# Patient Record
Sex: Female | Born: 1950 | Race: White | Hispanic: No | Marital: Married | Smoking: Former smoker
Health system: Southern US, Community
[De-identification: ages and names within clinical notes are randomized; demographics above are authoritative.]

## PROBLEM LIST (undated history)

## (undated) DIAGNOSIS — M1990 Unspecified osteoarthritis, unspecified site: Secondary | ICD-10-CM

## (undated) DIAGNOSIS — F419 Anxiety disorder, unspecified: Secondary | ICD-10-CM

## (undated) DIAGNOSIS — F32A Depression, unspecified: Secondary | ICD-10-CM

## (undated) DIAGNOSIS — K219 Gastro-esophageal reflux disease without esophagitis: Secondary | ICD-10-CM

## (undated) DIAGNOSIS — R51 Headache: Secondary | ICD-10-CM

## (undated) DIAGNOSIS — I10 Essential (primary) hypertension: Secondary | ICD-10-CM

## (undated) DIAGNOSIS — F329 Major depressive disorder, single episode, unspecified: Secondary | ICD-10-CM

## (undated) HISTORY — PX: ABDOMINAL HYSTERECTOMY: SHX81

## (undated) HISTORY — PX: COLONOSCOPY: SHX174

---

## 2001-10-04 ENCOUNTER — Encounter: Admission: RE | Admit: 2001-10-04 | Discharge: 2001-10-04 | Payer: Self-pay | Admitting: Unknown Physician Specialty

## 2001-10-04 ENCOUNTER — Encounter: Payer: Self-pay | Admitting: Unknown Physician Specialty

## 2005-01-09 ENCOUNTER — Ambulatory Visit (HOSPITAL_COMMUNITY): Admission: RE | Admit: 2005-01-09 | Discharge: 2005-01-09 | Payer: Self-pay | Admitting: Internal Medicine

## 2005-03-18 ENCOUNTER — Ambulatory Visit (HOSPITAL_COMMUNITY): Admission: RE | Admit: 2005-03-18 | Discharge: 2005-03-18 | Payer: Self-pay | Admitting: Family Medicine

## 2005-04-13 ENCOUNTER — Ambulatory Visit: Payer: Self-pay | Admitting: Internal Medicine

## 2005-06-01 ENCOUNTER — Ambulatory Visit: Payer: Self-pay | Admitting: Internal Medicine

## 2005-06-01 ENCOUNTER — Ambulatory Visit (HOSPITAL_COMMUNITY): Admission: RE | Admit: 2005-06-01 | Discharge: 2005-06-01 | Payer: Self-pay | Admitting: Internal Medicine

## 2005-06-12 ENCOUNTER — Ambulatory Visit (HOSPITAL_COMMUNITY): Admission: RE | Admit: 2005-06-12 | Discharge: 2005-06-12 | Payer: Self-pay | Admitting: Internal Medicine

## 2006-08-09 IMAGING — CR DG THORACIC SPINE 2V
3 series · 3 of 3 positions shown · non-contrast
Comparison: none

CLINICAL DATA: Upper back pain and swelling since October 2004 when she hurt back lifting weights.  
 THORACIC SPINE ? [facility] AND LATERAL VIEWS ? 01/09/05: 
 There is a mild upper thoracic scoliosis centered at T3-4 with convexity to the left.  There is no fracture or disk space narrowing or bone destruction.  No paraspinal mass.

[view not recorded (1 of 3)]
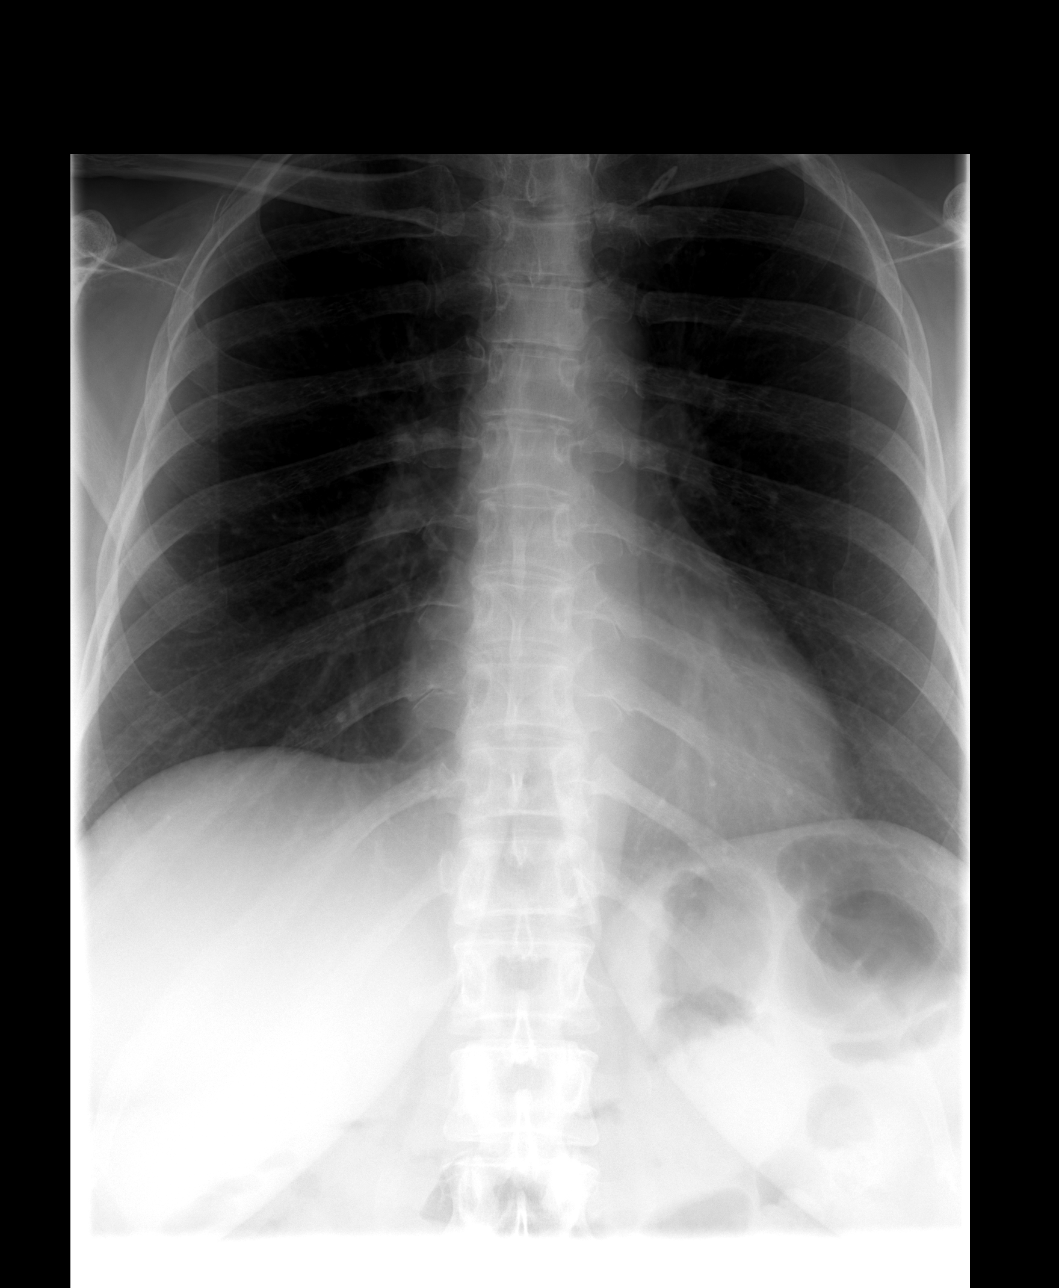

[view not recorded (2 of 3)]
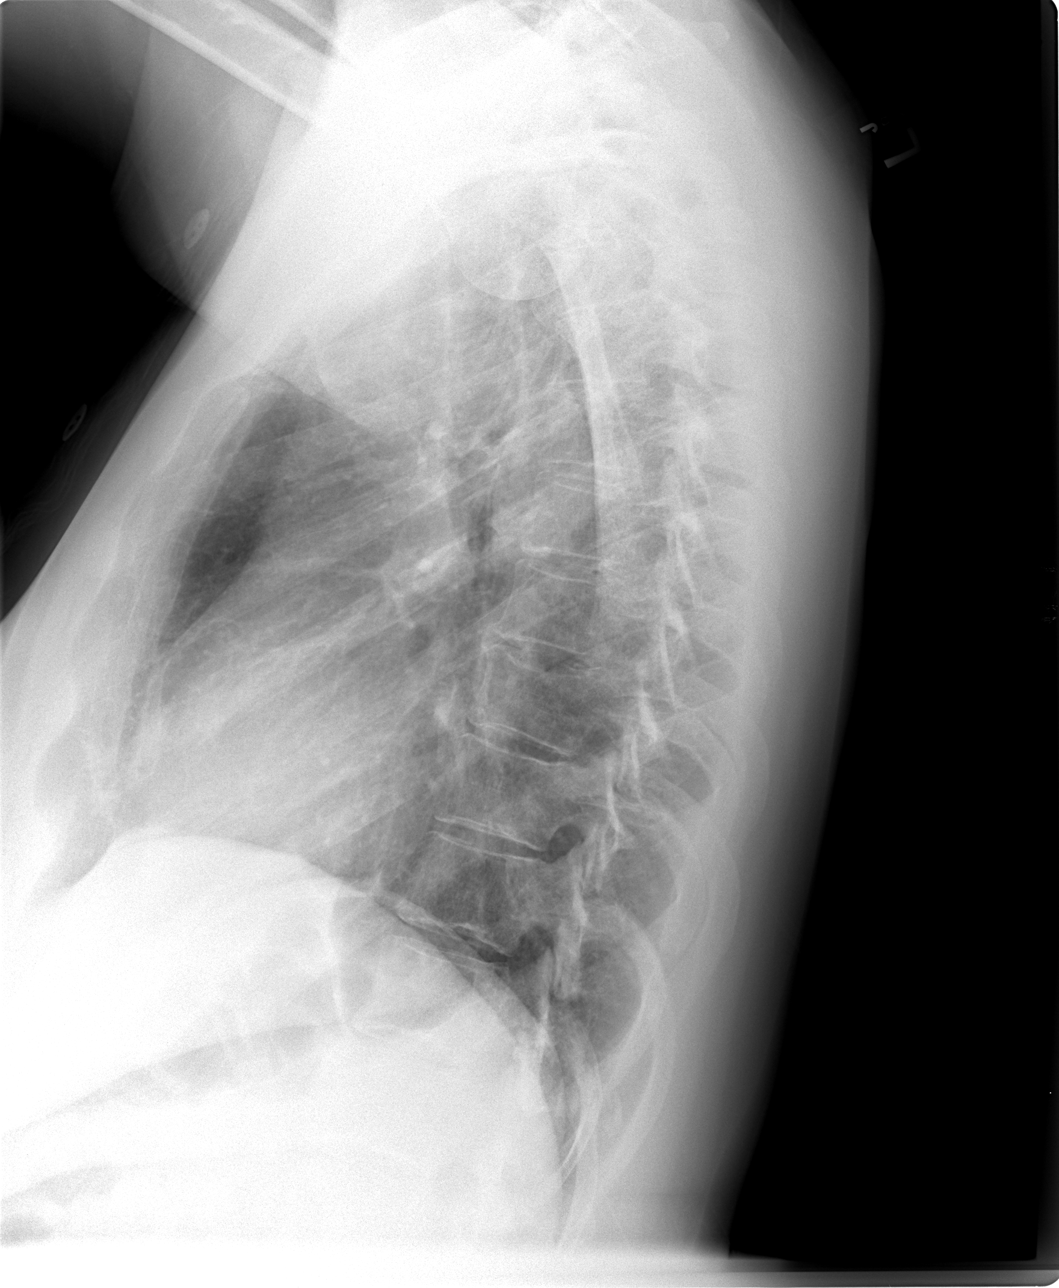

[view not recorded (3 of 3)]
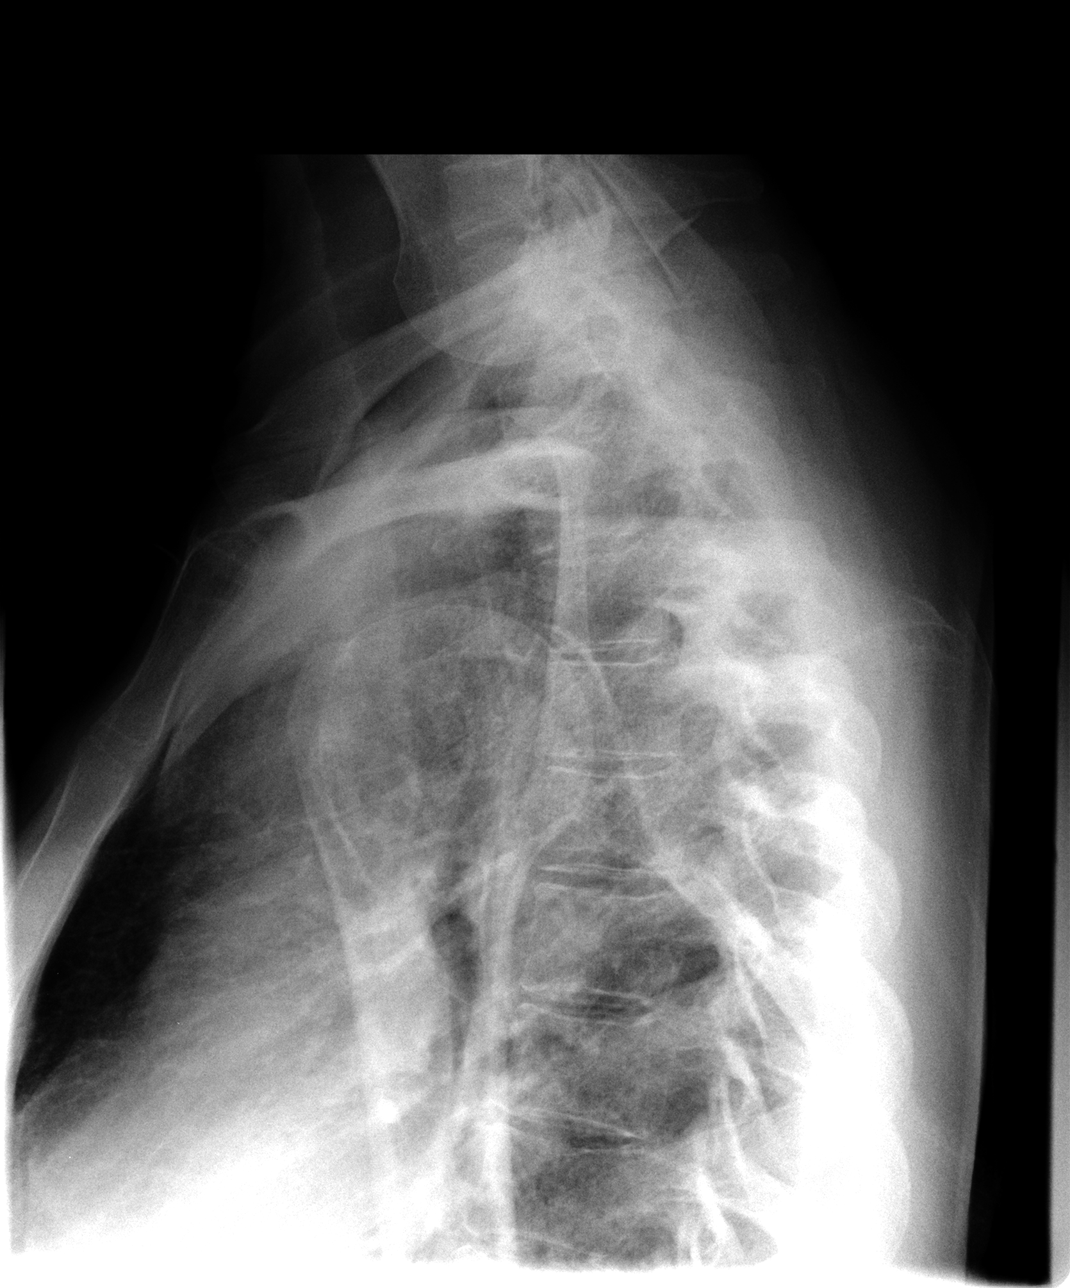

[3 of 3 positions shown; findings below may reference images not displayed]

IMPRESSION: No acute abnormality.  Mild upper thoracic scoliosis.
 CERVICAL SPINE ? 5 VIEWS ? 01/09/05:
 There is some mild degenerative disk disease at C5-6 and C6-7 and slight spurring primarily to the left of midline.  However, there is no significant foraminal stenosis.  There is no significant facet joint arthritis.  Again noted is the mild upper thoracic scoliosis.
IMPRESSION: Mild degenerative disk disease at C5-6 and C6-7.

## 2013-05-03 ENCOUNTER — Encounter (INDEPENDENT_AMBULATORY_CARE_PROVIDER_SITE_OTHER): Payer: Self-pay | Admitting: *Deleted

## 2013-05-10 ENCOUNTER — Telehealth (INDEPENDENT_AMBULATORY_CARE_PROVIDER_SITE_OTHER): Payer: Self-pay | Admitting: *Deleted

## 2013-05-10 ENCOUNTER — Other Ambulatory Visit (INDEPENDENT_AMBULATORY_CARE_PROVIDER_SITE_OTHER): Payer: Self-pay | Admitting: *Deleted

## 2013-05-10 ENCOUNTER — Encounter (INDEPENDENT_AMBULATORY_CARE_PROVIDER_SITE_OTHER): Payer: Self-pay | Admitting: *Deleted

## 2013-05-10 DIAGNOSIS — Z1211 Encounter for screening for malignant neoplasm of colon: Secondary | ICD-10-CM

## 2013-05-10 NOTE — Telephone Encounter (Signed)
Patient needs movi prep 

## 2013-05-11 MED ORDER — PEG-KCL-NACL-NASULF-NA ASC-C 100 G PO SOLR: 1.0000 | Freq: Once | ORAL | Status: DC

## 2013-05-18 ENCOUNTER — Telehealth (INDEPENDENT_AMBULATORY_CARE_PROVIDER_SITE_OTHER): Payer: Self-pay | Admitting: *Deleted

## 2013-05-18 NOTE — Telephone Encounter (Signed)
  Procedure: tcs  Reason/Indication:  screening  Has patient had this procedure before?  Yes, 2006  If so, when, by whom and where?    Is there a family history of colon cancer?  no  Who?  What age when diagnosed?    Is patient diabetic?   no      Does patient have prosthetic heart valve?  no  Do you have a pacemaker?  no  Has patient ever had endocarditis? no  Has patient had joint replacement within last 12 months?  no  Is patient on Coumadin, Plavix and/or Aspirin? yes  Medications: asa 81 mg daily, effexor 150 mg daily, metoprolol 50 mg bid, probiotic otc daily, xanax 1 mg 1/2 tab prn, miralax prn  Allergies: nkda  Medication Adjustment: asa 2 days  Procedure date & time: 06/15/13 at 1030

## 2013-05-19 NOTE — Telephone Encounter (Signed)
Her PCP, Dr Sherril Croon is requesting it

## 2013-05-19 NOTE — Telephone Encounter (Signed)
agree

## 2013-05-19 NOTE — Telephone Encounter (Signed)
It hasn't been 10 yrs? Does she need this colonnoscopy?

## 2013-05-25 ENCOUNTER — Encounter (HOSPITAL_COMMUNITY): Payer: Self-pay | Admitting: Pharmacy Technician

## 2013-06-15 ENCOUNTER — Encounter (HOSPITAL_COMMUNITY): Payer: Self-pay | Admitting: *Deleted

## 2013-06-15 ENCOUNTER — Encounter (HOSPITAL_COMMUNITY)
Admission: RE | Disposition: A | Discharge status: Home / Self Care | Payer: Self-pay | Source: Ambulatory Visit | Attending: Internal Medicine

## 2013-06-15 ENCOUNTER — Ambulatory Visit (HOSPITAL_COMMUNITY)
Admission: RE | Admit: 2013-06-15 | Discharge: 2013-06-15 | Disposition: A | Discharge status: Home / Self Care | Payer: BC Managed Care – PPO | Source: Ambulatory Visit | Attending: Internal Medicine | Admitting: Internal Medicine

## 2013-06-15 DIAGNOSIS — K921 Melena: Secondary | ICD-10-CM

## 2013-06-15 DIAGNOSIS — K644 Residual hemorrhoidal skin tags: Secondary | ICD-10-CM

## 2013-06-15 DIAGNOSIS — K5730 Diverticulosis of large intestine without perforation or abscess without bleeding: Secondary | ICD-10-CM

## 2013-06-15 DIAGNOSIS — R198 Other specified symptoms and signs involving the digestive system and abdomen: Secondary | ICD-10-CM

## 2013-06-15 DIAGNOSIS — Z1211 Encounter for screening for malignant neoplasm of colon: Secondary | ICD-10-CM

## 2013-06-15 DIAGNOSIS — I10 Essential (primary) hypertension: Secondary | ICD-10-CM | POA: Insufficient documentation

## 2013-06-15 HISTORY — PX: COLONOSCOPY: SHX5424

## 2013-06-15 HISTORY — DX: Headache: R51

## 2013-06-15 HISTORY — DX: Essential (primary) hypertension: I10

## 2013-06-15 HISTORY — DX: Major depressive disorder, single episode, unspecified: F32.9

## 2013-06-15 HISTORY — DX: Unspecified osteoarthritis, unspecified site: M19.90

## 2013-06-15 HISTORY — DX: Gastro-esophageal reflux disease without esophagitis: K21.9

## 2013-06-15 HISTORY — DX: Depression, unspecified: F32.A

## 2013-06-15 HISTORY — DX: Anxiety disorder, unspecified: F41.9

## 2013-06-15 SURGERY — COLONOSCOPY
Anesthesia: Moderate Sedation

## 2013-06-15 MED ORDER — STERILE WATER FOR IRRIGATION IR SOLN
Status: DC | PRN
  Administered 2013-06-15: 10:00:00

## 2013-06-15 MED ORDER — LUBIPROSTONE 8 MCG PO CAPS: 16.0000 ug | ORAL_CAPSULE | Freq: Every day | ORAL | Status: DC

## 2013-06-15 MED ORDER — MEPERIDINE HCL 50 MG/ML IJ SOLN
INTRAMUSCULAR | Status: AC
  Filled 2013-06-15: qty 1

## 2013-06-15 MED ORDER — MEPERIDINE HCL 50 MG/ML IJ SOLN
INTRAMUSCULAR | Status: DC | PRN
  Administered 2013-06-15 (×2): 25 mg via INTRAVENOUS

## 2013-06-15 MED ORDER — MIDAZOLAM HCL 5 MG/5ML IJ SOLN
INTRAMUSCULAR | Status: AC
  Filled 2013-06-15: qty 10

## 2013-06-15 MED ORDER — SODIUM CHLORIDE 0.9 % IV SOLN
INTRAVENOUS | Status: DC
  Administered 2013-06-15: 10:00:00 via INTRAVENOUS

## 2013-06-15 MED ORDER — MIDAZOLAM HCL 5 MG/5ML IJ SOLN
INTRAMUSCULAR | Status: DC | PRN
  Administered 2013-06-15: 1 mg via INTRAVENOUS
  Administered 2013-06-15: 2 mg via INTRAVENOUS
  Administered 2013-06-15: 3 mg via INTRAVENOUS
  Administered 2013-06-15 (×2): 2 mg via INTRAVENOUS

## 2013-06-15 NOTE — H&P (Signed)
Suzanne Reese is an 62 y.o. female.   Chief Complaint: Patient is here for colonoscopy. HPI: Patient is 62 year old Caucasian female who presents with change in bowel habits and intermittent hematochezia. She has tried MiraLax and Citrucel  without any benefit. She has good appetite. She denies abdominal pain. Patient's last colonoscopy was in 2006. Family history is negative for colorectal carcinoma.  Past Medical History  Diagnosis Date  . Hypertension   . Anxiety   . Depression   . Headache(784.0)     History of migraines  . Arthritis   . GERD (gastroesophageal reflux disease)     occasionally    Past Surgical History  Procedure Laterality Date  . Abdominal hysterectomy    . Colonoscopy      Family History  Problem Relation Age of Onset  . Colon cancer Neg Hx    Social History:  reports that she has quit smoking. Her smoking use included Cigarettes. She has a 10 pack-year smoking history. She does not have any smokeless tobacco history on file. She reports that she drinks about 1.5 ounces of alcohol per week. She reports that she does not use illicit drugs.  Allergies: No Known Allergies  Medications Prior to Admission  Medication Sig Dispense Refill  . aspirin EC 81 MG tablet Take 81 mg by mouth daily.      Marland Kitchen LORazepam (ATIVAN) 0.5 MG tablet Take 0.5 mg by mouth every 8 (eight) hours as needed for anxiety.      . metoprolol (LOPRESSOR) 50 MG tablet Take 50 mg by mouth 2 (two) times daily.      Marland Kitchen OVER THE COUNTER MEDICATION Take 1 capsule by mouth daily. Phillips probiotic      . venlafaxine XR (EFFEXOR-XR) 150 MG 24 hr capsule Take 150 mg by mouth daily.      Marland Kitchen ALPRAZolam (XANAX) 1 MG tablet Take 0.5 mg by mouth at bedtime as needed for sleep.        No results found for this or any previous visit (from the past 48 hour(s)). No results found.  ROS  Blood pressure 144/75, pulse 62, temperature 97.9 F (36.6 C), temperature source Oral, resp. rate 20, height 5\' 9"   (1.753 m), weight 184 lb (83.462 kg), SpO2 99.00%. Physical Exam  Constitutional: She appears well-developed and well-nourished.  HENT:  Mouth/Throat: Oropharynx is clear and moist.  Eyes: Conjunctivae are normal. No scleral icterus.  Neck: No thyromegaly present.  Cardiovascular: Normal rate, regular rhythm and normal heart sounds.   No murmur heard. Respiratory: Effort normal and breath sounds normal.  GI: Soft. She exhibits no distension and no mass. There is no tenderness.  Musculoskeletal: She exhibits no edema.  Lymphadenopathy:    She has no cervical adenopathy.  Neurological: She is alert.  Skin: Skin is warm and dry.     Assessment/Plan Change in bowel habits. Hematochezia. Diagnostic colonoscopy.  REHMAN,NAJEEB U 06/15/2013, 10:26 AM

## 2013-06-15 NOTE — Op Note (Signed)
COLONOSCOPY PROCEDURE REPORT  PATIENT:  Suzanne Reese  MR#:  [id number] Birthdate:  1951-05-06, 62 y.o., female Endoscopist:  Dr. Malissa Hippo, MD Referred By:  Dr. Ignatius Specking, MD Procedure Date: 06/15/2013  Procedure:   Colonoscopy  Indications:  Patient is 62 year old Caucasian female who presents with change in her bowel habits. She has noted her stools to be hard and also of different shapes and sizes. She also has intermittent hematochezia. Patient's last colonoscopy was over 8 years ago.  Informed Consent:  The procedure and risks were reviewed with the patient and informed consent was obtained.  Medications:  Demerol 50 mg IV Versed 10 mg IV  Description of procedure:  After a digital rectal exam was performed, that colonoscope was advanced from the anus through the rectum and colon to the area of the cecum, ileocecal valve and appendiceal orifice. The cecum was deeply intubated. These structures were well-seen and photographed for the record. From the level of the cecum and ileocecal valve, the scope was slowly and cautiously withdrawn. The mucosal surfaces were carefully surveyed utilizing scope tip to flexion to facilitate fold flattening as needed. The scope was pulled down into the rectum where a thorough exam including retroflexion was performed. Eminent ileum was also examined.  Findings:   Prep excellent. Normal mucosa of terminal ileum. Few small diverticula noted at sigmoid colon. Normal rectal mucosa. Small hemorrhoids below the dentate line.   Therapeutic/Diagnostic Maneuvers Performed:  None  Complications:  None  Cecal Withdrawal Time:  10 minutes  Impression:  Normal mucosa of terminal ileum. Few small diverticula at sigmoid colon. Small external hemorrhoids.  Recommendations:  Standard instructions given. Continue high fiber diet. Fiber supplement 3-4 g by mouth daily. Amitiza age to 16 mcg by mouth every morning. Progress report in two  months.  REHMAN,NAJEEB U  06/15/2013 11:02 AM  CC: Dr. Ignatius Specking., MD & Dr. Bonnetta Barry ref. provider found

## 2013-06-20 ENCOUNTER — Encounter (HOSPITAL_COMMUNITY): Payer: Self-pay | Admitting: Internal Medicine

## 2013-07-13 ENCOUNTER — Telehealth (INDEPENDENT_AMBULATORY_CARE_PROVIDER_SITE_OTHER): Payer: Self-pay | Admitting: *Deleted

## 2013-07-13 DIAGNOSIS — K649 Unspecified hemorrhoids: Secondary | ICD-10-CM

## 2013-07-13 MED ORDER — LIDOCAINE-HYDROCORTISONE ACE 3-0.5 % RE CREA: 1.0000 | TOPICAL_CREAM | Freq: Two times a day (BID) | RECTAL | Status: DC

## 2013-07-13 NOTE — Telephone Encounter (Signed)
Still having a lot of discomfort with her hemorrhoids (pain and raw feeling). Would like to see if Dr. Karilyn Cota would please call in a Rx for Lidazone HC Cream. Her husband has some and it seems to help. She is out. The return phone number is [phone].

## 2013-07-13 NOTE — Telephone Encounter (Signed)
Rx for Lidazone HC eprescribed to [facility] in [place].

## 2013-07-13 NOTE — Telephone Encounter (Signed)
Camelia Eng - would you please address this, please?

## 2015-10-29 DIAGNOSIS — R69 Illness, unspecified: Secondary | ICD-10-CM | POA: Diagnosis not present

## 2015-10-29 DIAGNOSIS — G4700 Insomnia, unspecified: Secondary | ICD-10-CM | POA: Diagnosis not present

## 2015-10-29 DIAGNOSIS — E782 Mixed hyperlipidemia: Secondary | ICD-10-CM | POA: Diagnosis not present

## 2015-10-29 DIAGNOSIS — E538 Deficiency of other specified B group vitamins: Secondary | ICD-10-CM | POA: Diagnosis not present

## 2015-10-29 DIAGNOSIS — I10 Essential (primary) hypertension: Secondary | ICD-10-CM | POA: Diagnosis not present

## 2015-12-02 DIAGNOSIS — Z1231 Encounter for screening mammogram for malignant neoplasm of breast: Secondary | ICD-10-CM | POA: Diagnosis not present

## 2015-12-11 DIAGNOSIS — N952 Postmenopausal atrophic vaginitis: Secondary | ICD-10-CM | POA: Diagnosis not present

## 2015-12-11 DIAGNOSIS — Z1272 Encounter for screening for malignant neoplasm of vagina: Secondary | ICD-10-CM | POA: Diagnosis not present

## 2015-12-11 DIAGNOSIS — Z6824 Body mass index (BMI) 24.0-24.9, adult: Secondary | ICD-10-CM | POA: Diagnosis not present

## 2015-12-11 DIAGNOSIS — Z01419 Encounter for gynecological examination (general) (routine) without abnormal findings: Secondary | ICD-10-CM | POA: Diagnosis not present

## 2016-01-13 DIAGNOSIS — L299 Pruritus, unspecified: Secondary | ICD-10-CM | POA: Diagnosis not present

## 2016-02-27 DIAGNOSIS — L821 Other seborrheic keratosis: Secondary | ICD-10-CM | POA: Diagnosis not present

## 2016-02-27 DIAGNOSIS — L309 Dermatitis, unspecified: Secondary | ICD-10-CM | POA: Diagnosis not present

## 2016-04-27 DIAGNOSIS — I10 Essential (primary) hypertension: Secondary | ICD-10-CM | POA: Diagnosis not present

## 2016-04-27 DIAGNOSIS — E782 Mixed hyperlipidemia: Secondary | ICD-10-CM | POA: Diagnosis not present

## 2016-06-16 DIAGNOSIS — H5210 Myopia, unspecified eye: Secondary | ICD-10-CM | POA: Diagnosis not present

## 2016-06-16 DIAGNOSIS — I10 Essential (primary) hypertension: Secondary | ICD-10-CM | POA: Diagnosis not present

## 2016-06-16 DIAGNOSIS — H25813 Combined forms of age-related cataract, bilateral: Secondary | ICD-10-CM | POA: Diagnosis not present

## 2016-08-24 DIAGNOSIS — H00022 Hordeolum internum right lower eyelid: Secondary | ICD-10-CM | POA: Diagnosis not present

## 2016-09-29 DIAGNOSIS — M546 Pain in thoracic spine: Secondary | ICD-10-CM | POA: Diagnosis not present

## 2016-09-29 DIAGNOSIS — M47812 Spondylosis without myelopathy or radiculopathy, cervical region: Secondary | ICD-10-CM | POA: Diagnosis not present

## 2016-09-29 DIAGNOSIS — M9901 Segmental and somatic dysfunction of cervical region: Secondary | ICD-10-CM | POA: Diagnosis not present

## 2016-09-30 DIAGNOSIS — M546 Pain in thoracic spine: Secondary | ICD-10-CM | POA: Diagnosis not present

## 2016-09-30 DIAGNOSIS — S134XXA Sprain of ligaments of cervical spine, initial encounter: Secondary | ICD-10-CM | POA: Diagnosis not present

## 2016-09-30 DIAGNOSIS — M9902 Segmental and somatic dysfunction of thoracic region: Secondary | ICD-10-CM | POA: Diagnosis not present

## 2016-09-30 DIAGNOSIS — M9901 Segmental and somatic dysfunction of cervical region: Secondary | ICD-10-CM | POA: Diagnosis not present

## 2016-09-30 DIAGNOSIS — M47812 Spondylosis without myelopathy or radiculopathy, cervical region: Secondary | ICD-10-CM | POA: Diagnosis not present

## 2016-10-02 DIAGNOSIS — M47812 Spondylosis without myelopathy or radiculopathy, cervical region: Secondary | ICD-10-CM | POA: Diagnosis not present

## 2016-10-02 DIAGNOSIS — M9902 Segmental and somatic dysfunction of thoracic region: Secondary | ICD-10-CM | POA: Diagnosis not present

## 2016-10-02 DIAGNOSIS — S134XXA Sprain of ligaments of cervical spine, initial encounter: Secondary | ICD-10-CM | POA: Diagnosis not present

## 2016-10-02 DIAGNOSIS — M546 Pain in thoracic spine: Secondary | ICD-10-CM | POA: Diagnosis not present

## 2016-10-02 DIAGNOSIS — M9901 Segmental and somatic dysfunction of cervical region: Secondary | ICD-10-CM | POA: Diagnosis not present

## 2016-10-05 DIAGNOSIS — S134XXA Sprain of ligaments of cervical spine, initial encounter: Secondary | ICD-10-CM | POA: Diagnosis not present

## 2016-10-05 DIAGNOSIS — M9901 Segmental and somatic dysfunction of cervical region: Secondary | ICD-10-CM | POA: Diagnosis not present

## 2016-10-05 DIAGNOSIS — M9902 Segmental and somatic dysfunction of thoracic region: Secondary | ICD-10-CM | POA: Diagnosis not present

## 2016-10-05 DIAGNOSIS — M47812 Spondylosis without myelopathy or radiculopathy, cervical region: Secondary | ICD-10-CM | POA: Diagnosis not present

## 2016-10-05 DIAGNOSIS — M546 Pain in thoracic spine: Secondary | ICD-10-CM | POA: Diagnosis not present

## 2016-11-02 DIAGNOSIS — R69 Illness, unspecified: Secondary | ICD-10-CM | POA: Diagnosis not present

## 2016-11-02 DIAGNOSIS — E782 Mixed hyperlipidemia: Secondary | ICD-10-CM | POA: Diagnosis not present

## 2016-11-02 DIAGNOSIS — I10 Essential (primary) hypertension: Secondary | ICD-10-CM | POA: Diagnosis not present

## 2016-11-02 DIAGNOSIS — G4700 Insomnia, unspecified: Secondary | ICD-10-CM | POA: Diagnosis not present

## 2016-11-02 DIAGNOSIS — E559 Vitamin D deficiency, unspecified: Secondary | ICD-10-CM | POA: Diagnosis not present

## 2016-11-12 DIAGNOSIS — M419 Scoliosis, unspecified: Secondary | ICD-10-CM | POA: Diagnosis not present

## 2016-11-12 DIAGNOSIS — E782 Mixed hyperlipidemia: Secondary | ICD-10-CM | POA: Diagnosis not present

## 2016-11-12 DIAGNOSIS — Z0000 Encounter for general adult medical examination without abnormal findings: Secondary | ICD-10-CM | POA: Diagnosis not present

## 2016-11-12 DIAGNOSIS — R251 Tremor, unspecified: Secondary | ICD-10-CM | POA: Diagnosis not present

## 2016-11-12 DIAGNOSIS — I10 Essential (primary) hypertension: Secondary | ICD-10-CM | POA: Diagnosis not present

## 2016-11-12 DIAGNOSIS — Z6823 Body mass index (BMI) 23.0-23.9, adult: Secondary | ICD-10-CM | POA: Diagnosis not present

## 2016-11-13 DIAGNOSIS — M47814 Spondylosis without myelopathy or radiculopathy, thoracic region: Secondary | ICD-10-CM | POA: Diagnosis not present

## 2016-11-13 DIAGNOSIS — M50322 Other cervical disc degeneration at C5-C6 level: Secondary | ICD-10-CM | POA: Diagnosis not present

## 2016-11-13 DIAGNOSIS — M50321 Other cervical disc degeneration at C4-C5 level: Secondary | ICD-10-CM | POA: Diagnosis not present

## 2016-11-13 DIAGNOSIS — M419 Scoliosis, unspecified: Secondary | ICD-10-CM | POA: Diagnosis not present

## 2016-11-13 DIAGNOSIS — M542 Cervicalgia: Secondary | ICD-10-CM | POA: Diagnosis not present

## 2016-11-13 DIAGNOSIS — M4316 Spondylolisthesis, lumbar region: Secondary | ICD-10-CM | POA: Diagnosis not present

## 2016-11-13 DIAGNOSIS — M47816 Spondylosis without myelopathy or radiculopathy, lumbar region: Secondary | ICD-10-CM | POA: Diagnosis not present

## 2016-12-03 DIAGNOSIS — Z1231 Encounter for screening mammogram for malignant neoplasm of breast: Secondary | ICD-10-CM | POA: Diagnosis not present

## 2017-01-25 ENCOUNTER — Other Ambulatory Visit (HOSPITAL_COMMUNITY): Payer: Self-pay | Admitting: Family Medicine

## 2017-01-25 DIAGNOSIS — M810 Age-related osteoporosis without current pathological fracture: Secondary | ICD-10-CM

## 2017-02-04 ENCOUNTER — Ambulatory Visit (HOSPITAL_COMMUNITY)
Admission: RE | Admit: 2017-02-04 | Discharge: 2017-02-04 | Disposition: A | Discharge status: Home / Self Care | Payer: Medicare HMO | Source: Ambulatory Visit | Attending: Family Medicine | Admitting: Family Medicine

## 2017-02-04 DIAGNOSIS — M810 Age-related osteoporosis without current pathological fracture: Secondary | ICD-10-CM | POA: Diagnosis not present

## 2017-02-04 DIAGNOSIS — E2839 Other primary ovarian failure: Secondary | ICD-10-CM | POA: Diagnosis not present

## 2017-02-10 DIAGNOSIS — M545 Low back pain: Secondary | ICD-10-CM | POA: Diagnosis not present

## 2017-02-10 DIAGNOSIS — M542 Cervicalgia: Secondary | ICD-10-CM | POA: Diagnosis not present

## 2017-02-10 DIAGNOSIS — M47812 Spondylosis without myelopathy or radiculopathy, cervical region: Secondary | ICD-10-CM | POA: Diagnosis not present

## 2017-02-12 DIAGNOSIS — M546 Pain in thoracic spine: Secondary | ICD-10-CM | POA: Diagnosis not present

## 2017-02-12 DIAGNOSIS — M542 Cervicalgia: Secondary | ICD-10-CM | POA: Diagnosis not present

## 2017-02-12 DIAGNOSIS — M6281 Muscle weakness (generalized): Secondary | ICD-10-CM | POA: Diagnosis not present

## 2017-02-12 DIAGNOSIS — M549 Dorsalgia, unspecified: Secondary | ICD-10-CM | POA: Diagnosis not present

## 2017-02-22 DIAGNOSIS — M6281 Muscle weakness (generalized): Secondary | ICD-10-CM | POA: Diagnosis not present

## 2017-02-22 DIAGNOSIS — M542 Cervicalgia: Secondary | ICD-10-CM | POA: Diagnosis not present

## 2017-02-22 DIAGNOSIS — M546 Pain in thoracic spine: Secondary | ICD-10-CM | POA: Diagnosis not present

## 2017-02-22 DIAGNOSIS — M549 Dorsalgia, unspecified: Secondary | ICD-10-CM | POA: Diagnosis not present

## 2017-05-31 DIAGNOSIS — I10 Essential (primary) hypertension: Secondary | ICD-10-CM | POA: Diagnosis not present

## 2017-05-31 DIAGNOSIS — E782 Mixed hyperlipidemia: Secondary | ICD-10-CM | POA: Diagnosis not present

## 2017-05-31 DIAGNOSIS — R251 Tremor, unspecified: Secondary | ICD-10-CM | POA: Diagnosis not present

## 2017-05-31 DIAGNOSIS — Z6823 Body mass index (BMI) 23.0-23.9, adult: Secondary | ICD-10-CM | POA: Diagnosis not present

## 2017-09-16 DIAGNOSIS — Z01419 Encounter for gynecological examination (general) (routine) without abnormal findings: Secondary | ICD-10-CM | POA: Diagnosis not present

## 2017-09-16 DIAGNOSIS — N952 Postmenopausal atrophic vaginitis: Secondary | ICD-10-CM | POA: Diagnosis not present

## 2017-09-16 DIAGNOSIS — N904 Leukoplakia of vulva: Secondary | ICD-10-CM | POA: Diagnosis not present

## 2017-11-17 DIAGNOSIS — R739 Hyperglycemia, unspecified: Secondary | ICD-10-CM | POA: Diagnosis not present

## 2017-11-17 DIAGNOSIS — G4700 Insomnia, unspecified: Secondary | ICD-10-CM | POA: Diagnosis not present

## 2017-11-17 DIAGNOSIS — R69 Illness, unspecified: Secondary | ICD-10-CM | POA: Diagnosis not present

## 2017-11-17 DIAGNOSIS — E559 Vitamin D deficiency, unspecified: Secondary | ICD-10-CM | POA: Diagnosis not present

## 2017-11-17 DIAGNOSIS — E7800 Pure hypercholesterolemia, unspecified: Secondary | ICD-10-CM | POA: Diagnosis not present

## 2017-11-17 DIAGNOSIS — R1011 Right upper quadrant pain: Secondary | ICD-10-CM | POA: Diagnosis not present

## 2017-11-17 DIAGNOSIS — R7303 Prediabetes: Secondary | ICD-10-CM | POA: Diagnosis not present

## 2017-11-17 DIAGNOSIS — I10 Essential (primary) hypertension: Secondary | ICD-10-CM | POA: Diagnosis not present

## 2017-11-22 DIAGNOSIS — R1011 Right upper quadrant pain: Secondary | ICD-10-CM | POA: Diagnosis not present

## 2017-12-16 DIAGNOSIS — E559 Vitamin D deficiency, unspecified: Secondary | ICD-10-CM | POA: Diagnosis not present

## 2017-12-16 DIAGNOSIS — E7800 Pure hypercholesterolemia, unspecified: Secondary | ICD-10-CM | POA: Diagnosis not present

## 2017-12-16 DIAGNOSIS — I10 Essential (primary) hypertension: Secondary | ICD-10-CM | POA: Diagnosis not present

## 2017-12-16 DIAGNOSIS — R93429 Abnormal radiologic findings on diagnostic imaging of unspecified kidney: Secondary | ICD-10-CM | POA: Diagnosis not present

## 2017-12-16 DIAGNOSIS — R69 Illness, unspecified: Secondary | ICD-10-CM | POA: Diagnosis not present

## 2017-12-16 DIAGNOSIS — G4700 Insomnia, unspecified: Secondary | ICD-10-CM | POA: Diagnosis not present

## 2017-12-16 DIAGNOSIS — E875 Hyperkalemia: Secondary | ICD-10-CM | POA: Diagnosis not present

## 2017-12-16 DIAGNOSIS — R739 Hyperglycemia, unspecified: Secondary | ICD-10-CM | POA: Diagnosis not present

## 2017-12-16 DIAGNOSIS — Q649 Congenital malformation of urinary system, unspecified: Secondary | ICD-10-CM | POA: Diagnosis not present

## 2018-02-23 DIAGNOSIS — I10 Essential (primary) hypertension: Secondary | ICD-10-CM | POA: Diagnosis not present

## 2018-02-23 DIAGNOSIS — R55 Syncope and collapse: Secondary | ICD-10-CM | POA: Diagnosis not present

## 2018-03-02 DIAGNOSIS — I10 Essential (primary) hypertension: Secondary | ICD-10-CM | POA: Diagnosis not present

## 2018-03-02 DIAGNOSIS — R0602 Shortness of breath: Secondary | ICD-10-CM | POA: Diagnosis not present

## 2018-03-02 DIAGNOSIS — I080 Rheumatic disorders of both mitral and aortic valves: Secondary | ICD-10-CM | POA: Diagnosis not present

## 2018-03-02 DIAGNOSIS — R55 Syncope and collapse: Secondary | ICD-10-CM | POA: Diagnosis not present

## 2018-03-04 DIAGNOSIS — W5501XA Bitten by cat, initial encounter: Secondary | ICD-10-CM | POA: Diagnosis not present

## 2018-03-04 DIAGNOSIS — I10 Essential (primary) hypertension: Secondary | ICD-10-CM | POA: Diagnosis not present

## 2018-03-04 DIAGNOSIS — R55 Syncope and collapse: Secondary | ICD-10-CM | POA: Diagnosis not present

## 2018-03-04 DIAGNOSIS — R69 Illness, unspecified: Secondary | ICD-10-CM | POA: Diagnosis not present

## 2018-03-15 DIAGNOSIS — R55 Syncope and collapse: Secondary | ICD-10-CM | POA: Diagnosis not present

## 2018-03-29 DIAGNOSIS — R739 Hyperglycemia, unspecified: Secondary | ICD-10-CM | POA: Diagnosis not present

## 2018-03-29 DIAGNOSIS — I10 Essential (primary) hypertension: Secondary | ICD-10-CM | POA: Diagnosis not present

## 2018-03-29 DIAGNOSIS — R69 Illness, unspecified: Secondary | ICD-10-CM | POA: Diagnosis not present

## 2018-03-29 DIAGNOSIS — E7800 Pure hypercholesterolemia, unspecified: Secondary | ICD-10-CM | POA: Diagnosis not present

## 2018-03-29 DIAGNOSIS — E559 Vitamin D deficiency, unspecified: Secondary | ICD-10-CM | POA: Diagnosis not present

## 2018-06-29 DIAGNOSIS — M545 Low back pain: Secondary | ICD-10-CM | POA: Diagnosis not present

## 2018-06-29 DIAGNOSIS — I10 Essential (primary) hypertension: Secondary | ICD-10-CM | POA: Diagnosis not present

## 2018-06-29 DIAGNOSIS — N179 Acute kidney failure, unspecified: Secondary | ICD-10-CM | POA: Diagnosis not present

## 2018-06-29 DIAGNOSIS — E559 Vitamin D deficiency, unspecified: Secondary | ICD-10-CM | POA: Diagnosis not present

## 2018-06-29 DIAGNOSIS — E7800 Pure hypercholesterolemia, unspecified: Secondary | ICD-10-CM | POA: Diagnosis not present

## 2018-07-06 DIAGNOSIS — Z79899 Other long term (current) drug therapy: Secondary | ICD-10-CM | POA: Diagnosis not present

## 2018-07-06 DIAGNOSIS — E559 Vitamin D deficiency, unspecified: Secondary | ICD-10-CM | POA: Diagnosis not present

## 2018-10-11 DIAGNOSIS — E559 Vitamin D deficiency, unspecified: Secondary | ICD-10-CM | POA: Diagnosis not present

## 2018-10-11 DIAGNOSIS — F411 Generalized anxiety disorder: Secondary | ICD-10-CM | POA: Diagnosis not present

## 2018-10-11 DIAGNOSIS — G459 Transient cerebral ischemic attack, unspecified: Secondary | ICD-10-CM | POA: Diagnosis not present

## 2018-10-11 DIAGNOSIS — Z79899 Other long term (current) drug therapy: Secondary | ICD-10-CM | POA: Diagnosis not present

## 2018-10-11 DIAGNOSIS — I6523 Occlusion and stenosis of bilateral carotid arteries: Secondary | ICD-10-CM | POA: Diagnosis not present

## 2018-10-11 DIAGNOSIS — R29818 Other symptoms and signs involving the nervous system: Secondary | ICD-10-CM | POA: Diagnosis not present

## 2018-10-11 DIAGNOSIS — R82998 Other abnormal findings in urine: Secondary | ICD-10-CM | POA: Diagnosis not present

## 2018-10-11 DIAGNOSIS — E7800 Pure hypercholesterolemia, unspecified: Secondary | ICD-10-CM | POA: Diagnosis not present

## 2018-10-11 DIAGNOSIS — I10 Essential (primary) hypertension: Secondary | ICD-10-CM | POA: Diagnosis not present

## 2018-10-11 DIAGNOSIS — G454 Transient global amnesia: Secondary | ICD-10-CM | POA: Diagnosis not present

## 2018-10-11 DIAGNOSIS — R69 Illness, unspecified: Secondary | ICD-10-CM | POA: Diagnosis not present

## 2018-10-12 DIAGNOSIS — R69 Illness, unspecified: Secondary | ICD-10-CM | POA: Diagnosis not present

## 2018-10-12 DIAGNOSIS — G459 Transient cerebral ischemic attack, unspecified: Secondary | ICD-10-CM | POA: Diagnosis not present

## 2018-10-12 DIAGNOSIS — I10 Essential (primary) hypertension: Secondary | ICD-10-CM | POA: Diagnosis not present

## 2018-10-12 DIAGNOSIS — E7800 Pure hypercholesterolemia, unspecified: Secondary | ICD-10-CM | POA: Diagnosis not present

## 2021-08-20 ENCOUNTER — Other Ambulatory Visit (HOSPITAL_COMMUNITY): Payer: Self-pay | Admitting: *Deleted

## 2021-08-22 ENCOUNTER — Ambulatory Visit (HOSPITAL_COMMUNITY): Payer: Self-pay

## 2021-08-22 ENCOUNTER — Other Ambulatory Visit (HOSPITAL_COMMUNITY): Payer: Self-pay | Admitting: Internal Medicine

## 2021-08-22 ENCOUNTER — Other Ambulatory Visit: Payer: Self-pay

## 2021-08-22 DIAGNOSIS — M810 Age-related osteoporosis without current pathological fracture: Secondary | ICD-10-CM

## 2021-09-01 ENCOUNTER — Ambulatory Visit (HOSPITAL_COMMUNITY)
Admission: RE | Admit: 2021-09-01 | Discharge: 2021-09-01 | Disposition: A | Discharge status: Home / Self Care | Payer: Medicare HMO | Source: Ambulatory Visit | Attending: Internal Medicine | Admitting: Internal Medicine

## 2021-09-01 ENCOUNTER — Other Ambulatory Visit: Payer: Self-pay

## 2021-09-01 DIAGNOSIS — M810 Age-related osteoporosis without current pathological fracture: Secondary | ICD-10-CM | POA: Insufficient documentation

## 2022-09-21 ENCOUNTER — Encounter: Payer: Self-pay | Admitting: Internal Medicine

## 2022-09-21 ENCOUNTER — Ambulatory Visit: Payer: Medicare HMO | Attending: Internal Medicine | Admitting: Internal Medicine

## 2022-09-21 VITALS — BP 122/84 | HR 56 | Ht 68.0 in | Wt 137.4 lb

## 2022-09-21 DIAGNOSIS — E785 Hyperlipidemia, unspecified: Secondary | ICD-10-CM | POA: Diagnosis not present

## 2022-09-21 DIAGNOSIS — R0600 Dyspnea, unspecified: Secondary | ICD-10-CM

## 2022-09-21 DIAGNOSIS — R0602 Shortness of breath: Secondary | ICD-10-CM

## 2022-09-21 DIAGNOSIS — I099 Rheumatic heart disease, unspecified: Secondary | ICD-10-CM

## 2022-09-21 DIAGNOSIS — I10 Essential (primary) hypertension: Secondary | ICD-10-CM

## 2022-09-21 NOTE — Progress Notes (Addendum)
Cardiology Office Note  Date: 09/21/2022   ID: Suzanne Reese, DOB [birth year], MRN [id number]  PCP:  Dr Stana Bunting Cardiologist:  None Electrophysiologist:  None   Reason for Office Visit: Shortness of breath at the request of Dr. Jimmye Norman.   History of Present Illness: Suzanne Reese is a 71 y.o. female known to have HTN, HLD, severe anxiety/panic attacks was referred to cardiology clinic for evaluation of resting SOB.  Patient denied any chest discomfort/SOB when she performs her daily activities/household chores/yard work. She says she cannot sit still and has to be up and active all day long. For the last 4 months, she noticed daily resting SOB which will go away eventually.  She had 1 episode of presyncope during the summer but she did not have breakfast on that day. No further episodes of presyncope after that.  No family history premature ASCVD.  Former smoker, quit 45 years ago. Drinks alcohol occasionally and denied any illicit drug abuse. She also lost her cat recently and her family numbers were diagnosed with cancer following which she was under extreme stress. But she stated her resting SOB appeared before the stress started.  She even started to take metoprolol succinate 75 mg once daily instead of 50 mg once daily (her usual prescription) due to increased blood pressure during the stressful events in the last few months.  She was also told she has rheumatic fever when she was 71 years old.  Past Medical History:  Diagnosis Date   Anxiety    Arthritis    Depression    GERD (gastroesophageal reflux disease)    occasionally   Headache(784.0)    History of migraines   Hypertension     Past Surgical History:  Procedure Laterality Date   ABDOMINAL HYSTERECTOMY     COLONOSCOPY     COLONOSCOPY N/A 06/15/2013   Procedure: COLONOSCOPY;  Surgeon: Rogene Houston, MD;  Location[facility];  Service: Endoscopy;  Laterality: N/A;  1030    Current Outpatient  Medications  Medication Sig Dispense Refill   aspirin EC 81 MG tablet Take 81 mg by mouth daily.     clobetasol ointment (TEMOVATE) [id number] % Apply 1 Application topically 2 (two) times daily.     LORazepam (ATIVAN) 0.5 MG tablet Take 0.5 mg by mouth every 8 (eight) hours as needed for anxiety.     metoprolol succinate (TOPROL-XL) 50 MG 24 hr tablet Take by mouth.     OVER THE COUNTER MEDICATION Take 1 capsule by mouth daily. Phillips probiotic     ALPRAZolam (XANAX) 1 MG tablet Take 0.5 mg by mouth at bedtime as needed for sleep. (Patient not taking: Reported on 09/21/2022)     No current facility-administered medications for this visit.   Allergies:  Augmentin [amoxicillin-pot clavulanate]   Social History: Quit smoking 45 years ago, takes alcohol occasionally and denied illicit drug use  Family History: The patient's family history includes Cancer in her mother; Heart failure in her mother; Parkinson's disease in her father.   ROS:  Please see the history of present illness. Otherwise, complete review of systems is positive for none.  All other systems are reviewed and negative.   Physical Exam: VS:  BP 122/84   Pulse (!) 56   Ht 5\' 8"  (1.727 m)   Wt 137 lb 6.4 oz (62.3 kg)   SpO2 95%   BMI 20.89 kg/m , BMI Body mass index is 20.89 kg/m.  Wt Readings from Last 3  Encounters:  09/21/22 137 lb 6.4 oz (62.3 kg)  06/15/13 184 lb (83.5 kg)    General: Patient appears comfortable at rest. HEENT: Conjunctiva and lids normal, oropharynx clear with moist mucosa. Neck: Supple, no elevated JVP or carotid bruits, no thyromegaly. Lungs: Clear to auscultation, nonlabored breathing at rest. Cardiac: Regular rate and rhythm, no S3 or significant systolic murmur, no pericardial rub. Abdomen: Soft, nontender, no hepatomegaly, bowel sounds present, no guarding or rebound. Extremities: No pitting edema, distal pulses 2+. Skin: Warm and dry. Musculoskeletal: No kyphosis. Neuropsychiatric: Alert  and oriented x3, affect grossly appropriate.  ECG:  An ECG dated 12//23 was personally reviewed today and demonstrated:  Sinus bradycardia with no ST-T changes  Recent Labwork: No results found for requested labs within last 365 days.  No results found for: "CHOL", "TRIG", "HDL", "CHOLHDL", "VLDL", "LDLCALC", "LDLDIRECT"  Other Studies Reviewed Today: I personally reviewed the heart care referral records from the PCP  Assessment and Plan: Patient is a 71 year old F known to have HTN was referred to cardiology clinic for evaluation of SOB.  # SOB -Patient has more than 10 METS (performs her daily activities, household chores and yard work with no SOB or chest discomfort). However she has daily SOB at rest which is likely multifactorial, stress, anxiety etc.  I do not think the resting SOB is cardiac in etiology.  No further cardiac testing is indicated for further assessment of resting SOB. No symptoms of sleep apnea.  # History of rheumatic fever -She was told she had rheumatic fever when she was 71 years old.  No history of valvular issues to her knowledge.  I will obtain 2D echo.  # HTN, controlled -Goal blood pressure is less than 140/90 mmHg. Patient started to take metoprolol succinate 75 mg once daily (instead 50 mg once daily) after recent stress in her family.  Instructed her to cut down the dose to 50 mg once daily and reassess her symptoms of SOB at rest.  # Hyperlipidemia, not at goal -LDL 166 per PCP records. Patient refused to be on statin (due to myalgias) or Zetia or injectables per PCP note. Hyperlipidemia management per PCP. Goal LDL less than 100.  # Primary prevention of CAD -Aspirin is not indicated for primary prevention of CAD after 71 years of age unless patient has definite evidence of CAD.  Stop aspirin.  I have spent a total of 44 minutes with patient reviewing chart, EKGs, labs and examining patient as well as establishing an assessment and plan that was  discussed with the patient.  > 50% of time was spent in direct patient care.      Medication Adjustments/Labs and Tests Ordered: Current medicines are reviewed at length with the patient today.  Concerns regarding medicines are outlined above.   Tests Ordered: Orders Placed This Encounter  Procedures   EKG 12-Lead   ECHOCARDIOGRAM COMPLETE    Medication Changes: No orders of the defined types were placed in this encounter.   Disposition:  Follow up  PRN  Signed, Jerni Selmer Verne Spurr, MD, 09/21/2022 8:13 AM    [facility] at [facility] [address] [place], [place] [address]

## 2022-09-21 NOTE — Patient Instructions (Signed)
Medication Instructions:  Your physician recommends that you continue on your current medications as directed. Please refer to the Current Medication list given to you today.   Labwork: None  Testing/Procedures: Your physician has requested that you have an echocardiogram. Echocardiography is a painless test that uses sound waves to create images of your heart. It provides your doctor with information about the size and shape of your heart and how well your heart's chambers and valves are working. This procedure takes approximately one hour. There are no restrictions for this procedure. Please do NOT wear cologne, perfume, aftershave, or lotions (deodorant is allowed). Please arrive 15 minutes prior to your appointment time.   Follow-Up: Follow up with Dr. Mallipeddi as needed.   Any Other Special Instructions Will Be Listed Below (If Applicable).     If you need a refill on your cardiac medications before your next appointment, please call your pharmacy.  

## 2022-09-25 ENCOUNTER — Ambulatory Visit (HOSPITAL_COMMUNITY)
Admission: RE | Admit: 2022-09-25 | Discharge: 2022-09-25 | Disposition: A | Discharge status: Home / Self Care | Payer: Medicare HMO | Source: Ambulatory Visit | Attending: Internal Medicine | Admitting: Internal Medicine

## 2022-09-25 ENCOUNTER — Telehealth: Payer: Self-pay | Admitting: Internal Medicine

## 2022-09-25 DIAGNOSIS — I099 Rheumatic heart disease, unspecified: Secondary | ICD-10-CM | POA: Diagnosis present

## 2022-09-25 LAB — ECHOCARDIOGRAM COMPLETE
AR max vel: 1.71 cm2
AV Area VTI: 1.82 cm2
AV Area mean vel: 1.81 cm2
AV Mean grad: 4.5 mmHg
AV Peak grad: 9.3 mmHg
Ao pk vel: 1.52 m/s
Area-P 1/2: 4.06 cm2
S' Lateral: 2.2 cm

## 2022-09-25 NOTE — Telephone Encounter (Signed)
Pt notified that [facility] is unable to remove Xanax from her list because we did not prescribe it. Pt voiced understanding.

## 2022-09-25 NOTE — Telephone Encounter (Signed)
Pt came in the office and stated that she does not take Xanax and she would like that removed from her chart.

## 2024-09-28 ENCOUNTER — Other Ambulatory Visit (HOSPITAL_COMMUNITY): Payer: Self-pay | Admitting: Internal Medicine

## 2024-09-28 DIAGNOSIS — M810 Age-related osteoporosis without current pathological fracture: Secondary | ICD-10-CM

## 2024-10-03 ENCOUNTER — Inpatient Hospital Stay (HOSPITAL_COMMUNITY): Admission: RE | Admit: 2024-10-03 | Discharge: 2024-10-03 | Attending: Internal Medicine | Admitting: Internal Medicine

## 2024-10-03 DIAGNOSIS — M810 Age-related osteoporosis without current pathological fracture: Secondary | ICD-10-CM | POA: Diagnosis present
